# Patient Record
Sex: Female | Born: 1980 | Race: White | Hispanic: No | Marital: Married | Smoking: Current some day smoker
Health system: Southern US, Community
[De-identification: ages and names within clinical notes are randomized; demographics above are authoritative.]

## PROBLEM LIST (undated history)

## (undated) DIAGNOSIS — I10 Essential (primary) hypertension: Secondary | ICD-10-CM

## (undated) DIAGNOSIS — R519 Headache, unspecified: Secondary | ICD-10-CM

## (undated) DIAGNOSIS — H539 Unspecified visual disturbance: Secondary | ICD-10-CM

## (undated) DIAGNOSIS — R51 Headache: Secondary | ICD-10-CM

## (undated) HISTORY — DX: Headache: R51

## (undated) HISTORY — DX: Essential (primary) hypertension: I10

## (undated) HISTORY — DX: Unspecified visual disturbance: H53.9

## (undated) HISTORY — DX: Headache, unspecified: R51.9

---

## 2009-06-20 ENCOUNTER — Ambulatory Visit: Payer: Self-pay | Admitting: Interventional Radiology

## 2009-06-20 ENCOUNTER — Ambulatory Visit (HOSPITAL_BASED_OUTPATIENT_CLINIC_OR_DEPARTMENT_OTHER): Admission: RE | Admit: 2009-06-20 | Discharge: 2009-06-20 | Payer: Self-pay | Admitting: Family Medicine

## 2011-06-28 ENCOUNTER — Ambulatory Visit (HOSPITAL_COMMUNITY): Payer: Self-pay | Admitting: Licensed Clinical Social Worker

## 2016-02-07 ENCOUNTER — Ambulatory Visit (INDEPENDENT_AMBULATORY_CARE_PROVIDER_SITE_OTHER): Payer: BLUE CROSS/BLUE SHIELD | Admitting: Neurology

## 2016-02-07 ENCOUNTER — Encounter: Payer: Self-pay | Admitting: Neurology

## 2016-02-07 VITALS — BP 162/99 | Ht 64.0 in | Wt 177.4 lb

## 2016-02-07 DIAGNOSIS — H4710 Unspecified papilledema: Secondary | ICD-10-CM | POA: Diagnosis not present

## 2016-02-07 DIAGNOSIS — H538 Other visual disturbances: Secondary | ICD-10-CM

## 2016-02-07 NOTE — Progress Notes (Signed)
NEUROLOGY CLINIC NEW PATIENT NOTE  NAME: Courtney Rasmussen DOB: 29-Nov-1980 REFERRING PHYSICIAN: Michaelle Copas, MD  I saw Courtney Rasmussen as a new consult in the neurovascular clinic today regarding  Chief Complaint  Patient presents with  . Referral    Referral from Dr. Despina Arias eye doctor for headaches,optic nerve edema, right eye moderate, left eye mild optic nerve edema, droopy eyelids  .  HPI: Courtney Rasmussen is a 35 y.o. female with PMH of depression on prozac who presents as a new patient for papilledema.   She stated that she has had mild HA for several years, right frontal pressure like, 5/10, on and off, takes tylenol or ibuprofen, HA gone in one hour, no photo- or phonophobia, no N/V, usually 2-3 per week. Bending over or cough not causing further HA but causes ear ringing.   For the last 6-7 months, she noticed blurry vision with far sight, seeing near objects without problem. She went to see optometrist and had new glasses. On 01/20/16 eye exam, she was found to have mild bilateral papilledema, she was referred here for further evaluation. She stated that her HA and blurry vision are not associated each other, pretty much separated events.   She had pre-eclampsia with pregnancy, and BP 240s. Today BP 150/101. She did not check BP at home. She denies DM or HLD. Has hx of depression for a long time and taking prozac.   She is current smoker, 1PPD for about 12 years. Social alcohol, no illicit drugs. She is not on OCP.   Past Medical History:  Diagnosis Date  . Headache   . Hypertension   . Vision abnormalities    No past surgical history on file. Family History  Problem Relation Age of Onset  . Seizures Father    Current Outpatient Prescriptions  Medication Sig Dispense Refill  . diphenhydrAMINE (BENADRYL) 25 MG tablet Take 25 mg by mouth at bedtime. Patient use for sleep    . FLUoxetine (PROZAC) 20 MG capsule Take 20 mg by mouth daily.    Marland Kitchen ibuprofen (ADVIL,MOTRIN) 800 MG  tablet TAKE ONE TABLET (800 MG TOTAL) BY MOUTH EVERY 8 (EIGHT) HOURS AS NEEDED FOR PAIN.  1   No current facility-administered medications for this visit.    Allergies  Allergen Reactions  . Penicillins Hives   Social History   Social History  . Marital status: Married    Spouse name: N/A  . Number of children: N/A  . Years of education: N/A   Occupational History  . Not on file.   Social History Main Topics  . Smoking status: Current Some Day Smoker    Years: 1.00  . Smokeless tobacco: Never Used  . Alcohol use Not on file  . Drug use: Unknown  . Sexual activity: Not on file   Other Topics Concern  . Not on file   Social History Narrative  . No narrative on file    Review of Systems Full 14 system review of systems performed and notable only for those listed, all others are neg:  Constitutional:   Cardiovascular: chest pain Ear/Nose/Throat:  Ringing in ears Skin:  Eyes:  Blurry vision, eye pain Respiratory:   Gastroitestinal:  diarrhea Genitourinary:  Hematology/Lymphatic:   Endocrine:  Musculoskeletal:   Allergy/Immunology:   Neurological:  HA, dizziness Psychiatric: depression, anxiety, decreased energy, disinterested in activities Sleep: insomnia   Physical Exam  Vitals:   02/07/16 1052  BP: (!) 162/99    General -  Well nourished, well developed, in no apparent distress.  Ophthalmologic - mild obscure of disc margin OU, but OD>OS.  Cardiovascular - Regular rate and rhythm with no murmur. Carotid pulses were 2+ without bruits .   Neck - supple, no nuchal rigidity .  Mental Status -  Level of arousal and orientation to time, place, and person were intact. Language including expression, naming, repetition, comprehension, reading, and writing was assessed and found intact. Attention span and concentration were normal. Recent and remote memory were intact. Fund of Knowledge was assessed and was intact.  Cranial Nerves II - XII - II - Visual  field intact OU. III, IV, VI - Extraocular movements intact, palpebral aperture left slightly small then right but no ptosis. V - Facial sensation intact bilaterally. VII - Facial movement intact bilaterally. VIII - Hearing & vestibular intact bilaterally. X - Palate elevates symmetrically. XI - Chin turning & shoulder shrug intact bilaterally. XII - Tongue protrusion intact.  Motor Strength - The patient's strength was normal in all extremities and pronator drift was absent.  Bulk was normal and fasciculations were absent.   Motor Tone - Muscle tone was assessed at the neck and appendages and was normal.  Reflexes - The patient's reflexes were normal in all extremities and she had no pathological reflexes.  Sensory - Light touch, temperature/pinprick, vibration and proprioception, and Romberg testing were assessed and were normal.    Coordination - The patient had normal movements in the hands and feet with no ataxia or dysmetria.  Tremor was absent.  Gait and Station - The patient's transfers, posture, gait, station, and turns were observed as normal.   Imaging none  Lab Review none   Assessment and Plan:   In summary, Courtney Rasmussen is a 35 y.o. female with PMH of depression presents as new pt for blurry vision and mild HA. Exam showed mild papilledema OU, with OD>OS, no ptosis. DDx including brain lesion, CVT or IIH. Will do MRI and MRV, as well as LP if needed. Will also check TSH.   - will do MRI brain with and without and MRV  - will consider LP to check OP if MRI unremarkable. - take tylenol or ibuprofen for HA - let us know if vision or HA getting worse  - check BP at home and record and bring over to PCP or me next visit - follow up with PCP - will check TSH and FT4 - follow up in 2 months or sooner depending on MRI findings or need of spinal tap   Thank you very much for the opportunity to participate in the care of this patient.  Please do not hesitate to call if  any questions or concerns arise.  Orders Placed This Encounter  Procedures  . MR Brain W Wo Contrast    Standing Status:   Future    Standing Expiration Date:   04/10/2017    Order Specific Question:   Reason for Exam (SYMPTOM  OR DIAGNOSIS REQUIRED)    Answer:   papilledema    Order Specific Question:   Preferred imaging location?    Answer:   Internal    Order Specific Question:   Does the patient have a pacemaker or implanted devices?    Answer:   No    Order Specific Question:   What is the patient's sedation requirement?    Answer:   No Sedation  . MR MRV HEAD WO CM    Standing Status:   Future  Standing Expiration Date:   04/09/2017    Order Specific Question:   Reason for Exam (SYMPTOM  OR DIAGNOSIS REQUIRED)    Answer:   papilledema    Order Specific Question:   Preferred imaging location?    Answer:   Internal    Order Specific Question:   What is the patient's sedation requirement?    Answer:   No Sedation    Order Specific Question:   Does the patient have a pacemaker or implanted devices?    Answer:   No  . Basic metabolic panel  . TSH + free T4  . CBC (no diff)  . Protime-INR    Meds ordered this encounter  Medications  . ibuprofen (ADVIL,MOTRIN) 800 MG tablet    Sig: TAKE ONE TABLET (800 MG TOTAL) BY MOUTH EVERY 8 (EIGHT) HOURS AS NEEDED FOR PAIN.    Refill:  1  . FLUoxetine (PROZAC) 20 MG capsule    Sig: Take 20 mg by mouth daily.  . diphenhydrAMINE (BENADRYL) 25 MG tablet    Sig: Take 25 mg by mouth at bedtime. Patient use for sleep    Patient Instructions  - will do MRI brain to rule out structural lesion - will consider spinal tap if MRI unremarkable. - take tylenol or ibuprofen for HA - follow up with optometrist for blurry vision and eye glasses.  - let us know if vision or HA getting worse  - check BP at home and record to see if you need BP meds - follow up with PCP - will draw blood to check kidney function - follow up in 2 months or sooner  depending on MRI findings or need of spinal tap   Marvel Plan, MD PhD [facility] [address], [address] [place], [place] [address] [phone]

## 2016-02-07 NOTE — Patient Instructions (Signed)
-   will do MRI brain to rule out structural lesion - will consider spinal tap if MRI unremarkable. - take tylenol or ibuprofen for HA - follow up with optometrist for blurry vision and eye glasses.  - let us know if vision or HA getting worse  - check BP at home and record to see if you need BP meds - follow up with PCP - will draw blood to check kidney function - follow up in 2 months or sooner depending on MRI findings or need of spinal tap

## 2016-02-08 LAB — PROTIME-INR
INR: 1 (ref 0.8–1.2)
PROTHROMBIN TIME: 9.9 s (ref 9.1–12.0)

## 2016-02-08 LAB — BASIC METABOLIC PANEL
BUN/Creatinine Ratio: 7 — ABNORMAL LOW (ref 9–23)
BUN: 5 mg/dL — AB (ref 6–20)
CALCIUM: 9.7 mg/dL (ref 8.7–10.2)
CO2: 22 mmol/L (ref 18–29)
CREATININE: 0.71 mg/dL (ref 0.57–1.00)
Chloride: 102 mmol/L (ref 96–106)
GFR calc Af Amer: 130 mL/min/{1.73_m2} (ref >59)
GFR, EST NON AFRICAN AMERICAN: 113 mL/min/{1.73_m2} (ref >59)
GLUCOSE: 91 mg/dL (ref 65–99)
Potassium: 5.1 mmol/L (ref 3.5–5.2)
SODIUM: 142 mmol/L (ref 134–144)

## 2016-02-08 LAB — TSH+FREE T4
Free T4: 1.24 ng/dL (ref 0.82–1.77)
TSH: 1.3 u[IU]/mL (ref 0.450–4.500)

## 2016-02-08 LAB — CBC
HEMATOCRIT: 45 % (ref 34.0–46.6)
HEMOGLOBIN: 15.3 g/dL (ref 11.1–15.9)
MCH: 31.4 pg (ref 26.6–33.0)
MCHC: 34 g/dL (ref 31.5–35.7)
MCV: 92 fL (ref 79–97)
Platelets: 240 10*3/uL (ref 150–379)
RBC: 4.88 x10E6/uL (ref 3.77–5.28)
RDW: 13.3 % (ref 12.3–15.4)
WBC: 9.7 10*3/uL (ref 3.4–10.8)

## 2016-02-13 ENCOUNTER — Telehealth: Payer: Self-pay | Admitting: Neurology

## 2016-02-13 NOTE — Telephone Encounter (Signed)
Pt called about scheduling imaging studies

## 2016-02-15 NOTE — Telephone Encounter (Signed)
Called patient and gave her the number to [facility], she was appreciative.

## 2016-02-27 ENCOUNTER — Ambulatory Visit
Admission: RE | Admit: 2016-02-27 | Discharge: 2016-02-27 | Disposition: A | Discharge status: Home / Self Care | Payer: BLUE CROSS/BLUE SHIELD | Source: Ambulatory Visit | Attending: Neurology | Admitting: Neurology

## 2016-02-27 DIAGNOSIS — H4710 Unspecified papilledema: Secondary | ICD-10-CM | POA: Diagnosis not present

## 2016-02-27 MED ORDER — GADOBENATE DIMEGLUMINE 529 MG/ML IV SOLN: 15.0000 mL | Freq: Once | INTRAVENOUS | Status: DC | PRN

## 2016-02-27 MED ORDER — GADOBENATE DIMEGLUMINE 529 MG/ML IV SOLN
15.0000 mL | Freq: Once | INTRAVENOUS | Status: AC | PRN
  Administered 2016-02-27: 15 mL via INTRAVENOUS

## 2016-02-28 ENCOUNTER — Telehealth: Payer: Self-pay | Admitting: Neurology

## 2016-02-28 NOTE — Telephone Encounter (Signed)
Patient called requesting MRI results, states she left message this morning and no one has called her back. Please call [phone].

## 2016-02-28 NOTE — Telephone Encounter (Signed)
Pt called requesting MRI results please call.

## 2016-02-28 NOTE — Telephone Encounter (Signed)
Patient called back regarding MRI results, states she has called twice today and "still no one has called her back". Patient advised (per [name]), Dr. Roda ShuttersXu is working at the hospital this week. A message was sent to him this morning. He is aware of the MRI being done. Patient will be called within 24 to 48 hours of results. Patient states "she will call back again tomorrow or the next day".

## 2016-02-28 NOTE — Telephone Encounter (Signed)
Called pt and delivered MRI and MRV result to her which were all normal. I told her that I will arrange her to come in next Friday 12pm to do the LP. She is OK with that.  Hi, Courtney Rasmussen, could you please schedule her to come in next Friday 8/25 for LP at 12pm. We also need the LP consent to be signed at that time. Thanks.  Marvel PlanJindong Aireana Ryland, MD PhD Stroke Neurology 02/28/2016 6:05 PM

## 2016-02-28 NOTE — Telephone Encounter (Signed)
If patient calls back she had MRI yesterday. Dr. Roda ShuttersXu is working at the hospital this week. A message was sent to him this am. He is aware of the MRi being done. Pt will be call within 24 to 48 hours of results.

## 2016-02-29 NOTE — Telephone Encounter (Signed)
Tried to call patient back that her LP was schedule next Friday at 1200pm. Unable to leave message pts vm was full. Will try later.

## 2016-02-29 NOTE — Telephone Encounter (Signed)
Rn call patient back about confirming the LP procedure. Rn stated to check in at 1130am. Rn explain a call was made earlier but her vm was full. Pt will have her husband drive her to and from the appt.

## 2016-03-09 ENCOUNTER — Ambulatory Visit (INDEPENDENT_AMBULATORY_CARE_PROVIDER_SITE_OTHER): Payer: BLUE CROSS/BLUE SHIELD | Admitting: Neurology

## 2016-03-09 VITALS — BP 111/80 | HR 73 | Ht 64.0 in | Wt 180.8 lb

## 2016-03-09 DIAGNOSIS — H4710 Unspecified papilledema: Secondary | ICD-10-CM

## 2016-03-09 MED ORDER — ACETAZOLAMIDE 250 MG PO TABS: ORAL_TABLET | ORAL | 2 refills | Status: DC

## 2016-03-09 NOTE — Progress Notes (Signed)
Procedure Note: Lumbar puncture  Indications: assess for CNS pathology   Operator: Marvel PlanJindong Hassan Blackshire, MD, PhD  Others present: Sherrie GeorgeKatrina Murrell, RN  Indications, risks, and benefits explained to patient / surrogate decision maker and informed consent obtained. Time-out was performed, with all individuals present agreeing on the procedure to be performed, the site of procedure, and the patient identity.  Patient positioned, prepped and draped in usual sterile fashion. L3-4 space located using bilateral iliac crests as landmarks. 1% Lidocaine without epinephrine was used to anesthetize the area. An 20G spinal needle was introduced into the sub- arachnoid space. Stylet was removed with appropriate fluid return. Needle removed after adequate fluid collected. Blood loss was minimal. A dry guaze dressing was placed over insertion site. Patient tolerated the procedure well and no complications were observed. Spontaneous movement of bilateral extremities were observed after the procedure.  Opening pressure: 20 cm H20 with leg extended  Total fluid removed: 4ml  Color of fluid: clear  Sent for: cell count + diff , glucose, protein, VDRL, oligoclonal band   Marvel PlanJindong Cheronda Erck, MD PhD Stroke Neurology 03/09/2016 1:07 PM

## 2016-03-09 NOTE — Progress Notes (Signed)
Pt OP was 20cmH2O, at higher end of the normal pressure. Taken into consideration of mild papilledema, will initiate diamox low dose 250mg  Qhs x 5 days and then 250mg  bid. Will need close follow up with ophthalmology or optometrist.   [name], MD PhD Stroke Neurology 03/09/2016 1:12 PM  Meds ordered this encounter  Medications  . acetaZOLAMIDE (DIAMOX) 250 MG tablet    Sig: Take one tab 250mg  nightly for 5 days and then 250mg  bid after    Dispense:  60 tablet    Refill:  2

## 2016-03-10 LAB — CELL COUNT, CSF
NUC CELL # CSF: 2 {cells}/uL (ref 0–5)
RBC, CSF: 33 /uL

## 2016-03-11 ENCOUNTER — Telehealth: Payer: Self-pay | Admitting: Diagnostic Neuroimaging

## 2016-03-11 NOTE — Telephone Encounter (Signed)
Patient called in. Had LPO in the office last Friday 03/09/16. Today, having intermittent low back pain shooting to the right leg (new). No weakness. No other issues. Advised option to go to ER or urgent care today to evaluate. Patient would like ot hold off. WIll forward message to Dr. Roda ShuttersXu and his nurse to review. Patient may need to come into office for eval vs get MRI lumbar spine.   Suanne MarkerVIKRAM R. PENUMALLI, MD 03/11/2016, 5:28 PM Certified in Neurology, Neurophysiology and Neuroimaging  [facility] [address], [address] [place], [place] [address] [phone]

## 2016-03-12 ENCOUNTER — Telehealth: Payer: Self-pay | Admitting: Neurology

## 2016-03-12 ENCOUNTER — Emergency Department (HOSPITAL_COMMUNITY)
Admission: EM | Admit: 2016-03-12 | Discharge: 2016-03-13 | Disposition: A | Discharge status: Home / Self Care | Payer: BLUE CROSS/BLUE SHIELD | Attending: Emergency Medicine | Admitting: Emergency Medicine

## 2016-03-12 ENCOUNTER — Encounter (HOSPITAL_COMMUNITY): Payer: Self-pay

## 2016-03-12 DIAGNOSIS — M5416 Radiculopathy, lumbar region: Secondary | ICD-10-CM | POA: Insufficient documentation

## 2016-03-12 DIAGNOSIS — I10 Essential (primary) hypertension: Secondary | ICD-10-CM | POA: Diagnosis not present

## 2016-03-12 DIAGNOSIS — F17200 Nicotine dependence, unspecified, uncomplicated: Secondary | ICD-10-CM | POA: Insufficient documentation

## 2016-03-12 DIAGNOSIS — M549 Dorsalgia, unspecified: Secondary | ICD-10-CM

## 2016-03-12 DIAGNOSIS — R200 Anesthesia of skin: Secondary | ICD-10-CM

## 2016-03-12 DIAGNOSIS — M545 Low back pain: Secondary | ICD-10-CM | POA: Diagnosis present

## 2016-03-12 NOTE — ED Provider Notes (Signed)
[facility]-EMERGENCY DEPT Provider Note   CSN: [id number] Arrival date & time: 03/12/16  1746  By signing my name below, I, Courtney Rasmussen, attest that this documentation has been prepared under the direction and in the presence of Courtney Crease, MD. Electronically Signed: [name], ED Scribe. 03/12/16. 11:56 PM.    History   Chief Complaint Chief Complaint  Patient presents with  . Back Pain  . Leg Pain   HPI Comments: Courtney Rasmussen is a 35 y.o. female with a hx of hypertension who presents to the Emergency Department complaining of gradually worsening moderate lower back pain and pain to site of recent LP that radiates down right leg to her popliteal onset yesterday. He reports associated tingling in her upper leg. She explains that she had an LP 3 days ago after being diagnosed with Papilledema and her symptoms began yesterday afternoon. No alleviating factors noted. Pt has not tried any medications PTA. She denies any fever, chills, numbness, weakness, or bowel/bladder incontinence.    The history is provided by the patient. No language interpreter was used.  Back Pain   This is a new problem. The current episode started yesterday. The problem has been gradually worsening. The pain radiates to the right knee. The pain is moderate. Associated symptoms include leg pain and tingling. Pertinent negatives include no fever, no numbness, no bowel incontinence, no bladder incontinence and no weakness. She has tried nothing for the symptoms.  Extremity Pain  Associated symptoms include tingling. Pertinent negatives include no numbness.    Past Medical History:  Diagnosis Date  . Headache   . Hypertension   . Vision abnormalities     Patient Active Problem List   Diagnosis Date Noted  . Papilledema 02/07/2016  . Blurry vision 02/07/2016    History reviewed. No pertinent surgical history.  OB History    No data available       Home Medications    Prior to  Admission medications   Medication Sig Start Date End Date Taking? Authorizing Provider  acetaZOLAMIDE (DIAMOX) 250 MG tablet Take one tab 250mg  nightly for 5 days and then 250mg  bid after Patient taking differently: Take 250 mg by mouth 2 (two) times daily.  03/09/16  Yes Marvel Plan, MD  FLUoxetine (PROZAC) 10 MG tablet Take 30 mg by mouth daily.   Yes Historical Provider, MD  methylPREDNISolone (MEDROL DOSEPAK) 4 MG TBPK tablet As directed 03/13/16   Courtney Crease, MD  oxyCODONE-acetaminophen (PERCOCET/ROXICET) 5-325 MG tablet Take 1-2 tablets by mouth every 4 (four) hours as needed for severe pain. 03/13/16   Courtney Crease, MD    Family History Family History  Problem Relation Age of Onset  . Seizures Father     Social History Social History  Substance Use Topics  . Smoking status: Current Some Day Smoker    Years: 1.00  . Smokeless tobacco: Never Used  . Alcohol use Not on file     Allergies   Penicillins   Review of Systems Review of Systems  Constitutional: Negative for chills and fever.  Gastrointestinal: Negative for bowel incontinence.  Genitourinary: Negative for bladder incontinence.  Musculoskeletal: Positive for back pain.  Neurological: Positive for tingling. Negative for weakness and numbness.  All other systems reviewed and are negative.    Physical Exam Updated Vital Signs BP (!) 133/108   Pulse 76   Temp 98.1 F (36.7 C)   Resp 18   Ht 5\' 4"  (1.626 m)   Wt 180  lb (81.6 kg)   SpO2 100%   BMI 30.90 kg/m   Physical Exam  Constitutional: She is oriented to person, place, and time. She appears well-developed and well-nourished. No distress.  HENT:  Head: Normocephalic and atraumatic.  Right Ear: Hearing normal.  Left Ear: Hearing normal.  Nose: Nose normal.  Mouth/Throat: Oropharynx is clear and moist and mucous membranes are normal.  Eyes: Conjunctivae and EOM are normal. Pupils are equal, round, and reactive to light.  Neck:  Normal range of motion. Neck supple.  Cardiovascular: Regular rhythm, S1 normal and S2 normal.  Exam reveals no gallop and no friction rub.   No murmur heard. Pulmonary/Chest: Effort normal and breath sounds normal. No respiratory distress. She exhibits no tenderness.  Abdominal: Soft. Normal appearance and bowel sounds are normal. There is no hepatosplenomegaly. There is no tenderness. There is no rebound, no guarding, no tenderness at McBurney's point and negative Murphy's sign. No hernia.  Musculoskeletal: Normal range of motion. She exhibits tenderness.  Lower back: no erythema, induration, fluctuance. Mild soft tissue tenderness. Some radicular pain with straight leg raise on the right. Normal strength and sensation. Normal patellar reflex  Neurological: She is alert and oriented to person, place, and time. She has normal strength. No cranial nerve deficit or sensory deficit. Coordination normal. GCS eye subscore is 4. GCS verbal subscore is 5. GCS motor subscore is 6.  Skin: Skin is warm, dry and intact. No rash noted. No cyanosis.  Psychiatric: She has a normal mood and affect. Her speech is normal and behavior is normal. Thought content normal.  Nursing note and vitals reviewed.    ED Treatments / Results  DIAGNOSTIC STUDIES: Oxygen Saturation is 100% on RA, normal by my interpretation.    COORDINATION OF CARE: 11:44 PM- Pt advised of plan for treatment and pt agrees. She will receive lab work and MR lumbar spine w and w/o contrast for further evaluation.    Labs (all labs ordered are listed, but only abnormal results are displayed) Labs Reviewed  CBC WITH DIFFERENTIAL/PLATELET - Abnormal; Notable for the following:       Result Value   WBC 10.8 (*)    RBC 5.13 (*)    Hemoglobin 16.1 (*)    HCT 47.5 (*)    All other components within normal limits  BASIC METABOLIC PANEL - Abnormal; Notable for the following:    CO2 20 (*)    All other components within normal limits     EKG  EKG Interpretation None       Radiology Mr Lumbar Spine W Wo Contrast  Result Date: 03/13/2016 CLINICAL DATA:  Gradually worsening lower back pain common near site of recent LP. EXAM: MRI LUMBAR SPINE WITHOUT AND WITH CONTRAST TECHNIQUE: Multiplanar and multiecho pulse sequences of the lumbar spine were obtained without and with intravenous contrast. CONTRAST:  18mL MULTIHANCE GADOBENATE DIMEGLUMINE 529 MG/ML IV SOLN COMPARISON:  None. FINDINGS: Segmentation:  Normal Alignment:  Normal Vertebrae: No acute compression fracture, facet edema or focal marrow lesion. Conus medullaris: Extends to the L1 level and appears normal. There is no abnormal contrast enhancement of the nerve roots. There is no epidural hematoma or fluid collection. Paraspinal and other soft tissues: The visualized aorta, IVC and iliac vessels are normal. The visualized retroperitoneal organs and paraspinal soft tissues are normal. Disc levels: There is congenital narrowing of the spinal canal secondary to short pedicles. T12-L1: Normal disc space and facet joints. No spinal canal stenosis. No neuroforaminal stenosis. L1-L2:  Normal disc space and facet joints. No spinal canal stenosis. No neuroforaminal stenosis. L2-L3: Normal disc space and facet joints. No spinal canal stenosis. No neuroforaminal stenosis. L3-L4: Minimal disc bulge and mild facet hypertrophy. Narrowing of the spinal canal secondary to congenitally short pedicles. No neuroforaminal stenosis. L4-L5: Minimal disc bulge and mild facet hypertrophy. Mild to moderate narrowing of the spinal canal due to congenital short pedicles. No neuroforaminal stenosis. L5-S1: Normal disc space and facet joints. No spinal canal stenosis. No neuroforaminal stenosis. IMPRESSION: 1. No abnormal contrast enhancement of the nerve roots or epidural fluid collection/hematoma. 2. 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 This is greatest at the L3-4 and  L4-5 levels, where there are superimposed small disc bulges. Electronically Signed   By: Deatra Robinson M.D.   On: 03/13/2016 02:11    Procedures Procedures (including critical care time)  Medications Ordered in ED Medications  dexamethasone (DECADRON) injection 10 mg (not administered)  HYDROmorphone (DILAUDID) injection 1 mg (not administered)  ketorolac (TORADOL) 15 MG/ML injection 15 mg (not administered)  HYDROmorphone (DILAUDID) injection 1 mg (1 mg Intravenous Given 03/13/16 0031)  ondansetron (ZOFRAN) injection 4 mg (4 mg Intravenous Given 03/13/16 0031)  acetaZOLAMIDE (DIAMOX) tablet 250 mg (250 mg Oral Given 03/13/16 0206)  diazepam (VALIUM) injection 5 mg (5 mg Intravenous Given 03/13/16 0051)  gadobenate dimeglumine (MULTIHANCE) injection 20 mL (18 mLs Intravenous Contrast Given 03/13/16 0148)     Initial Impression / Assessment and Plan / ED Course  Courtney Crease, MD has reviewed the triage vital signs and the nursing notes.  Pertinent labs & imaging results that were available during my care of the patient were reviewed by me and considered in my medical decision making (see chart for details).  Clinical Course   Patient presents to the emergency department for evaluation of right lower back pain and radiculopathy. Patient underwent lumbar puncture procedure 3 days ago during workup for papilledema. Patient reports that approximately 1 day after the procedure she started having low back pain and the pain now is sharp and radiates down her right leg. She did not have any neurologic deficit on examination. She was referred to the emergency department for emergent MRI to rule out hematoma. MRI has been performed and there are no acute abnormalities noted. Patient reassured, will treat with analgesia and steroid, follow-up with neurology.  Final Clinical Impressions(s) / ED Diagnoses   Final diagnoses:  Numbness  Lumbar radiculopathy    New Prescriptions New  Prescriptions   METHYLPREDNISOLONE (MEDROL DOSEPAK) 4 MG TBPK TABLET    As directed   OXYCODONE-ACETAMINOPHEN (PERCOCET/ROXICET) 5-325 MG TABLET    Take 1-2 tablets by mouth every 4 (four) hours as needed for severe pain.   I personally performed the services described in this documentation, which was scribed in my presence. The recorded information has been reviewed and is accurate.    Courtney Crease, MD 03/13/16 [phone]

## 2016-03-12 NOTE — Telephone Encounter (Signed)
Pt called as advised by her husband. The msg was relayed, [facility] 

## 2016-03-12 NOTE — ED Notes (Signed)
Pt states that she had "fuzzy tingling" feeling in the right knee for two hours around lunch time today. Pain getting steadily worse throughout the day. Pain in the lumbar and sacral region of spine feels more like pressure.

## 2016-03-12 NOTE — Telephone Encounter (Signed)
Called home phone and she was not available and VM was full and not able to leave message.  Called her husband and told him that [name] should go to ER for MRI lumbar spine with and without contrast to rule out lumbar epidural hematoma. Husband said he will call pt and let her know. He will also ask pt to call us back.   Hi, [name], if pt calls back, please ask her to go to ER for MRI to rule out above. Thanks.  Courtney PlanJindong Weylyn Ricciuti, MD PhD Stroke Neurology 03/12/2016 1:11 PM

## 2016-03-12 NOTE — ED Notes (Signed)
Attempted to update pts vitals family said she is outside

## 2016-03-12 NOTE — Telephone Encounter (Signed)
Per prior conversation, patient was supposed to go to the emergency room to get a lumbar spine MRI with and without contrast, as advised by Dr. Roda ShuttersXu earlier today, I will place an order. Patient called the after hours call line and reported that there was no order in place. I will place an order for an MRI lumbar spine without contrast to be done at [facility], patient at the ER now. She was advised to proceed to the emergency room by Dr. Roda ShuttersXu. I called patient back to notify her.

## 2016-03-12 NOTE — ED Triage Notes (Signed)
Pt states received lumbar puncture on Friday. Pt states back pain that radiates to R leg today.

## 2016-03-13 ENCOUNTER — Encounter (HOSPITAL_COMMUNITY): Payer: Self-pay | Admitting: *Deleted

## 2016-03-13 ENCOUNTER — Emergency Department (HOSPITAL_COMMUNITY): Payer: BLUE CROSS/BLUE SHIELD

## 2016-03-13 LAB — CBC WITH DIFFERENTIAL/PLATELET
BASOS ABS: 0.1 10*3/uL (ref 0.0–0.1)
BASOS PCT: 1 %
EOS ABS: 0.4 10*3/uL (ref 0.0–0.7)
Eosinophils Relative: 4 %
HEMATOCRIT: 47.5 % — AB (ref 36.0–46.0)
HEMOGLOBIN: 16.1 g/dL — AB (ref 12.0–15.0)
Lymphocytes Relative: 30 %
Lymphs Abs: 3.2 10*3/uL (ref 0.7–4.0)
MCH: 31.4 pg (ref 26.0–34.0)
MCHC: 33.9 g/dL (ref 30.0–36.0)
MCV: 92.6 fL (ref 78.0–100.0)
MONO ABS: 0.7 10*3/uL (ref 0.1–1.0)
Monocytes Relative: 6 %
NEUTROS ABS: 6.5 10*3/uL (ref 1.7–7.7)
NEUTROS PCT: 59 %
Platelets: 211 10*3/uL (ref 150–400)
RBC: 5.13 MIL/uL — ABNORMAL HIGH (ref 3.87–5.11)
RDW: 13.6 % (ref 11.5–15.5)
WBC: 10.8 10*3/uL — ABNORMAL HIGH (ref 4.0–10.5)

## 2016-03-13 LAB — BASIC METABOLIC PANEL
ANION GAP: 6 (ref 5–15)
BUN: 6 mg/dL (ref 6–20)
CALCIUM: 9.6 mg/dL (ref 8.9–10.3)
CHLORIDE: 111 mmol/L (ref 101–111)
CO2: 20 mmol/L — ABNORMAL LOW (ref 22–32)
CREATININE: 0.85 mg/dL (ref 0.44–1.00)
GFR calc non Af Amer: >60 mL/min (ref >60)
Glucose, Bld: 89 mg/dL (ref 65–99)
Potassium: 3.6 mmol/L (ref 3.5–5.1)
SODIUM: 137 mmol/L (ref 135–145)

## 2016-03-13 MED ORDER — DIAZEPAM 5 MG/ML IJ SOLN
5.0000 mg | Freq: Once | INTRAMUSCULAR | Status: AC
  Administered 2016-03-13: 5 mg via INTRAVENOUS
  Filled 2016-03-13: qty 2

## 2016-03-13 MED ORDER — OXYCODONE-ACETAMINOPHEN 5-325 MG PO TABS: 1.0000 | ORAL_TABLET | ORAL | 0 refills | Status: DC | PRN

## 2016-03-13 MED ORDER — HYDROMORPHONE HCL 1 MG/ML IJ SOLN
1.0000 mg | Freq: Once | INTRAMUSCULAR | Status: AC
  Administered 2016-03-13: 1 mg via INTRAVENOUS
  Filled 2016-03-13: qty 1

## 2016-03-13 MED ORDER — ACETAZOLAMIDE 250 MG PO TABS
250.0000 mg | ORAL_TABLET | Freq: Once | ORAL | Status: AC
  Administered 2016-03-13: 250 mg via ORAL
  Filled 2016-03-13: qty 1

## 2016-03-13 MED ORDER — ONDANSETRON HCL 4 MG/2ML IJ SOLN
4.0000 mg | Freq: Once | INTRAMUSCULAR | Status: AC
  Administered 2016-03-13: 4 mg via INTRAVENOUS
  Filled 2016-03-13: qty 2

## 2016-03-13 MED ORDER — GADOBENATE DIMEGLUMINE 529 MG/ML IV SOLN
20.0000 mL | Freq: Once | INTRAVENOUS | Status: AC | PRN
  Administered 2016-03-13: 18 mL via INTRAVENOUS

## 2016-03-13 MED ORDER — METHYLPREDNISOLONE 4 MG PO TBPK: ORAL_TABLET | ORAL | 0 refills | Status: DC

## 2016-03-13 MED ORDER — DEXAMETHASONE SODIUM PHOSPHATE 10 MG/ML IJ SOLN
10.0000 mg | Freq: Once | INTRAMUSCULAR | Status: AC
  Administered 2016-03-13: 10 mg via INTRAVENOUS
  Filled 2016-03-13: qty 1

## 2016-03-13 MED ORDER — KETOROLAC TROMETHAMINE 15 MG/ML IJ SOLN
15.0000 mg | Freq: Once | INTRAMUSCULAR | Status: AC
  Administered 2016-03-13: 15 mg via INTRAVENOUS
  Filled 2016-03-13: qty 1

## 2016-03-13 NOTE — ED Notes (Signed)
Patient is stable, ambulatory, verbalized understanding of medications prescribed and discharge instructions, sent home with spouse.

## 2016-03-15 ENCOUNTER — Other Ambulatory Visit: Payer: Self-pay | Admitting: Neurology

## 2016-03-15 ENCOUNTER — Telehealth: Payer: Self-pay | Admitting: Neurology

## 2016-03-15 LAB — GLUCOSE, CSF: GLUCOSE CSF: 61 mg/dL (ref 40–70)

## 2016-03-15 LAB — VDRL, CSF: SYPHILIS VDRL QUANT CSF: NONREACTIVE

## 2016-03-15 LAB — OLIGOCLONAL BANDS, CSF + SERM

## 2016-03-15 LAB — PROTEIN, CSF: TOTAL PROTEIN, CSF: 46.9 mg/dL — AB (ref 0.0–44.0)

## 2016-03-15 MED ORDER — OXYCODONE-ACETAMINOPHEN 10-325 MG PO TABS: 1.0000 | ORAL_TABLET | Freq: Four times a day (QID) | ORAL | 0 refills | Status: DC | PRN

## 2016-03-15 NOTE — Telephone Encounter (Signed)
I am OK to give her percocet 5-325mg  one tablet every 6 hours PRN for 5 days. Total 20 tablets and no refills. Thanks.  Marvel PlanJindong Yariana Hoaglund, MD PhD Stroke Neurology 03/15/2016 5:07 PM

## 2016-03-15 NOTE — Telephone Encounter (Signed)
Rn call patient back about her perocet medication. Pt was given 20 pills at the hospital for leg pain. Pt went to ED on 03/13/2016 because of pain from the LP on  03/09/2016. Rn stated she will only being given 20 pills taking one every 6 hours with no refills. Rn stated PEr Dr. Roda ShuttersXu she should be healing when the 20 pills are up. Pt verbalized understanding. Rn stated its a schedule two and she has to pick up from office and sign out. Rn advise pt to fill out DPr for her husband to be on the form.

## 2016-03-15 NOTE — Telephone Encounter (Signed)
Pt called in requesting rx for pain medication. She was seen in there ER but they did not giver her enough to last through the weekend. Please call and advise [phone] oxyCODONE-acetaminophen (PERCOCET/ROXICET) 5-325 MG tablet

## 2016-04-19 ENCOUNTER — Ambulatory Visit (INDEPENDENT_AMBULATORY_CARE_PROVIDER_SITE_OTHER): Payer: BLUE CROSS/BLUE SHIELD | Admitting: Neurology

## 2016-04-19 ENCOUNTER — Encounter: Payer: Self-pay | Admitting: Neurology

## 2016-04-19 VITALS — BP 132/91 | HR 84 | Ht 64.0 in | Wt 182.6 lb

## 2016-04-19 DIAGNOSIS — R200 Anesthesia of skin: Secondary | ICD-10-CM | POA: Diagnosis not present

## 2016-04-19 DIAGNOSIS — H4710 Unspecified papilledema: Secondary | ICD-10-CM | POA: Diagnosis not present

## 2016-04-19 DIAGNOSIS — R202 Paresthesia of skin: Secondary | ICD-10-CM | POA: Diagnosis not present

## 2016-04-19 NOTE — Patient Instructions (Signed)
-   take diamox half tablet in the morning and continue one tab at night - will do blood draw today to check potassium - make appointment with eye doctor to check back of eye ASAP.  - follow up with PCP regularly - follow up in 3 months.

## 2016-04-19 NOTE — Progress Notes (Signed)
NEUROLOGY CLINIC FOLLOW UP NOTE  NAME: Courtney Rasmussen DOB: July 30, 1980  HPI: Courtney Rasmussen is a 35 y.o. female with PMH of depression on prozac who presents as a new patient for papilledema.   She stated that she has had mild HA for several years, right frontal pressure like, 5/10, on and off, takes tylenol or ibuprofen, HA gone in one hour, no photo- or phonophobia, no N/V, usually 2-3 per week. Bending over or cough not causing further HA but causes ear ringing.   For the last 6-7 months, she noticed blurry vision with far sight, seeing near objects without problem. She went to see optometrist and had new glasses. On 01/20/16 eye exam, she was found to have mild bilateral papilledema, she was referred here for further evaluation. She stated that her HA and blurry vision are not associated each other, pretty much separated events.   She had pre-eclampsia with pregnancy, and BP 240s. Today BP 150/101. She did not check BP at home. She denies DM or HLD. Has hx of depression for a long time and taking prozac.   She is current smoker, 1PPD for about 12 years. Social alcohol, no illicit drugs. She is not on OCP.   Interval history: During the interval time, MRI and MRV were negative for structural lesion or CVT. She then had LP in our office and opening pressure was 20cmH2O, at higher end of the normal pressure. Started on diamox with titration to 250mg  bid. Post LP, she had LBP and was sent to ER to rule out epidural hematoma. 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   Since then, she complains of fingertip and toe numbness and tingling, and sometimes face and nose tingling sensation, which she feels annoying. The tingling sensation 2-3 times a day and lasting about and goes away. She stated that her mild HA and vision did not change much over the time but she has not seen her eye doctor since early July.   She also mentioned that she developed HTN with pregnancy and was  put on diuretics before and her potassium was low with diuretics. This time with diamox, she also goes to bathroom a lot. So she was worrying about her potassium too. Her BP was 132/91 today in clinic.   Past Medical History:  Diagnosis Date  . Headache   . Hypertension   . Vision abnormalities    History reviewed. No pertinent surgical history. Family History  Problem Relation Age of Onset  . Seizures Father    Current Outpatient Prescriptions  Medication Sig Dispense Refill  . acetaZOLAMIDE (DIAMOX) 250 MG tablet Take one tab 250mg  nightly for 5 days and then 250mg  bid after (Patient taking differently: Take 250 mg by mouth 2 (two) times daily. ) 60 tablet 2   No current facility-administered medications for this visit.    Allergies  Allergen Reactions  . Penicillins Hives   Social History   Social History  . Marital status: Married    Spouse name: N/A  . Number of children: N/A  . Years of education: N/A   Occupational History  . Not on file.   Social History Main Topics  . Smoking status: Current Some Day Smoker    Years: 1.00  . Smokeless tobacco: Never Used  . Alcohol use 1.2 oz/week    1 Cans of beer, 1 Glasses of wine per week     Comment: occasionally  . Drug use: No  . Sexual activity: Not on  file   Other Topics Concern  . Not on file   Social History Narrative  . No narrative on file    Review of Systems Full 14 system review of systems performed and notable only for those listed, all others are neg:  Constitutional:   Cardiovascular:  Ear/Nose/Throat:   Skin:  Eyes:  Blurry vision, eye pain Respiratory:   Gastroitestinal:  diarrhea Genitourinary:  Hematology/Lymphatic:   Endocrine: excessive thirst Musculoskeletal:  Back pain, muscle cramps Allergy/Immunology:   Neurological:  HA, numbness Psychiatric: nervous, anxiety Sleep:    Physical Exam  Vitals:   04/19/16 1138  BP: (!) 132/91  Pulse: 84    General - Well nourished, well  developed, in no apparent distress.  Ophthalmologic - subtle obscure of disc margin OU.  Cardiovascular - Regular rate and rhythm with no murmur. Carotid pulses were 2+ without bruits .   Neck - supple, no nuchal rigidity .  Mental Status -  Level of arousal and orientation to time, place, and person were intact. Language including expression, naming, repetition, comprehension, reading, and writing was assessed and found intact. Attention span and concentration were normal. Recent and remote memory were intact. Fund of Knowledge was assessed and was intact.  Cranial Nerves II - XII - II - Visual field intact OU. III, IV, VI - Extraocular movements intact, palpebral aperture left slightly small then right but no ptosis. V - Facial sensation intact bilaterally. VII - Facial movement intact bilaterally. VIII - Hearing & vestibular intact bilaterally. X - Palate elevates symmetrically. XI - Chin turning & shoulder shrug intact bilaterally. XII - Tongue protrusion intact.  Motor Strength - The patient's strength was normal in all extremities and pronator drift was absent.  Bulk was normal and fasciculations were absent.   Motor Tone - Muscle tone was assessed at the neck and appendages and was normal.  Reflexes - The patient's reflexes were normal in all extremities and she had no pathological reflexes.  Sensory - Light touch, temperature/pinprick, vibration and proprioception, and Romberg testing were assessed and were normal.    Coordination - The patient had normal movements in the hands and feet with no ataxia or dysmetria.  Tremor was absent.  Gait and Station - The patient's transfers, posture, gait, station, and turns were observed as normal.   Imaging I have personally reviewed the radiological images below and agree with the radiology interpretations.  MRI brain with and without Normal   MRV head Normal   Mr Lumbar Spine W Wo Contrast 03/13/2016 IMPRESSION: 1. No  abnormal contrast enhancement of the nerve roots or epidural fluid collection/hematoma. 2. 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 This is greatest at the L3-4 and L4-5 levels, where there are superimposed small disc bulges.   Lab Review Component     Latest Ref Rng & Units 03/09/2016  Color, CSF     Colorless Colorless  Clarity, CSF     Clear Clear  Nuc cell # CSF     0 - 5 cells/uL 2  RBC, CSF     None Seen /uL 33  Polys, CSF      CANCELED  Lymphs, CSF      CANCELED  Macrophages NFr CSF Manual      CANCELED  CELL FRACT CSF-IMP      Comment  Glucose, CSF     40 - 70 mg/dL 61  Protein, CSF     0.0 - 44.0 mg/dL 69.646.9 (H)  VDRL Quant, CSF     Non Rea:<1:1 Non Reactive  Oligoclonal Bands.IT Ser+CSF Ql      Comment   Component     Latest Ref Rng & Units 02/07/2016  TSH     0.450 - 4.500 uIU/mL 1.300  T4,Free(Direct)     0.82 - 1.77 ng/dL 1.61     Assessment:   In summary, Seretha Estabrooks is a 35 y.o. female with PMH of depression presents as new pt for blurry vision and mild HA. Exam showed mild papilledema OU, with OD>OS, no ptosis. MRI and MRV normal, as well as normal CSF but OP 20cmH2O. She was put on diamox 250mg  bid. However, she complains of fingertip and toe as well as face and nose numbness and tingling. Will decrease morning dose of diamox. Request ophthalmology follow up.   Plan: - decrease diamox to 125mg  am and 250mg  pm - will check potassium due to diuretic effect - make appointment with ophthalmology for repeat fundi exam.  - follow up with PCP regularly - follow up in 3 months.   I spent more than 25 minutes of face to face time with the patient. Greater than 50% of time was spent in counseling and coordination of care. We discussed medication side effect, medication dose adjustment, follow up with eye doctor   Orders Placed This Encounter  Procedures  . Basic metabolic panel  . CBC (no diff)  . Magnesium    No orders of  the defined types were placed in this encounter.   Patient Instructions  - take diamox half tablet in the morning and continue one tab at night - will do blood draw today to check potassium - make appointment with eye doctor to check back of eye ASAP.  - follow up with PCP regularly - follow up in 3 months.    Marvel Plan, MD PhD [facility] [address] [place], [place] [address] [phone]

## 2016-04-20 LAB — CBC
HEMATOCRIT: 44.2 % (ref 34.0–46.6)
HEMOGLOBIN: 14.6 g/dL (ref 11.1–15.9)
MCH: 30.4 pg (ref 26.6–33.0)
MCHC: 33 g/dL (ref 31.5–35.7)
MCV: 92 fL (ref 79–97)
PLATELETS: 225 10*3/uL (ref 150–379)
RBC: 4.81 x10E6/uL (ref 3.77–5.28)
RDW: 13.4 % (ref 12.3–15.4)
WBC: 11.5 10*3/uL — ABNORMAL HIGH (ref 3.4–10.8)

## 2016-04-20 LAB — BASIC METABOLIC PANEL
BUN/Creatinine Ratio: 9 (ref 9–23)
BUN: 7 mg/dL (ref 6–20)
CO2: 19 mmol/L (ref 18–29)
CREATININE: 0.74 mg/dL (ref 0.57–1.00)
Calcium: 9.5 mg/dL (ref 8.7–10.2)
Chloride: 111 mmol/L — ABNORMAL HIGH (ref 96–106)
GFR calc Af Amer: 124 mL/min/{1.73_m2} (ref >59)
GFR, EST NON AFRICAN AMERICAN: 108 mL/min/{1.73_m2} (ref >59)
Glucose: 93 mg/dL (ref 65–99)
Potassium: 4.3 mmol/L (ref 3.5–5.2)
SODIUM: 145 mmol/L — AB (ref 134–144)

## 2016-04-20 LAB — MAGNESIUM: Magnesium: 1.9 mg/dL (ref 1.6–2.3)

## 2016-04-23 ENCOUNTER — Telehealth: Payer: Self-pay

## 2016-04-23 NOTE — Telephone Encounter (Signed)
-----   Message from Marvel PlanJindong Xu, MD sent at 04/20/2016 12:41 PM EDT ----- Could you please let the patient know that the blood test done recently in our office showed normal potassium level. Her sodium and white blood cells elevated just a little bit, likely due to dehydration. She is recommended to drink more fluid. Thanks.  Marvel PlanJindong Xu, MD PhD Stroke Neurology 04/20/2016 12:41 PM

## 2016-04-23 NOTE — Telephone Encounter (Signed)
RN call patient about her lab work. Rn stated the blood work showed normal potassium. Sodium and white blood cells little elevated, likely due to dehydration. Per Dr. Roda ShuttersXu she needs to drink more water. Pt verbalized understanding.

## 2016-04-23 NOTE — Telephone Encounter (Signed)
Unable to leave vm for patient. Rn was tried to call for lab but her vm was full.

## 2016-07-24 ENCOUNTER — Encounter: Payer: Self-pay | Admitting: Neurology

## 2016-07-24 ENCOUNTER — Ambulatory Visit (INDEPENDENT_AMBULATORY_CARE_PROVIDER_SITE_OTHER): Payer: BLUE CROSS/BLUE SHIELD | Admitting: Neurology

## 2016-07-24 VITALS — BP 137/89 | HR 68 | Wt 186.8 lb

## 2016-07-24 DIAGNOSIS — R202 Paresthesia of skin: Secondary | ICD-10-CM

## 2016-07-24 DIAGNOSIS — R200 Anesthesia of skin: Secondary | ICD-10-CM | POA: Diagnosis not present

## 2016-07-24 DIAGNOSIS — H538 Other visual disturbances: Secondary | ICD-10-CM | POA: Diagnosis not present

## 2016-07-24 DIAGNOSIS — H4710 Unspecified papilledema: Secondary | ICD-10-CM

## 2016-07-24 NOTE — Progress Notes (Signed)
NEUROLOGY CLINIC FOLLOW UP NOTE  NAME: Courtney Rasmussen DOB: [birth year]  HPI: Courtney Rasmussen is a 36 y.o. female with PMH of depression on prozac who presents as a new patient for papilledema.   She stated that she has had mild HA for several years, right frontal pressure like, 5/10, on and off, takes tylenol or ibuprofen, HA gone in one hour, no photo- or phonophobia, no N/V, usually 2-3 per week. Bending over or cough not causing further HA but causes ear ringing.   For the last 6-7 months, she noticed blurry vision with far sight, seeing near objects without problem. She went to see optometrist and had new glasses. On 01/20/16 eye exam, she was found to have mild bilateral papilledema, she was referred here for further evaluation. She stated that her HA and blurry vision are not associated each other, pretty much separated events.   She had pre-eclampsia with pregnancy, and BP 240s. Today BP 150/101. She did not check BP at home. She denies DM or HLD. Has hx of depression for a long time and taking prozac.   She is current smoker, 1PPD for about 12 years. Social alcohol, no illicit drugs. She is not on OCP.   Follow up 04/19/16 - MRI and MRV were negative for structural lesion or CVT. She then had LP in our office and opening pressure was 20cmH2O, at higher end of the normal pressure. Started on diamox with titration to 250mg  bid. Post LP, she had LBP and was sent to ER to rule out epidural hematoma. 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   Since then, she complains of fingertip and toe numbness and tingling, and sometimes face and nose tingling sensation, which she feels annoying. The tingling sensation 2-3 times a day and lasting about and goes away. She stated that her mild HA and vision did not change much over the time but she has not seen her eye doctor since early July.   She also mentioned that she developed HTN with pregnancy and was put on diuretics before  and her potassium was low with diuretics. This time with diamox, she also goes to bathroom a lot. So she was worrying about her potassium too. Her BP was 132/91 today in clinic.   Interval history: During the interval time, pt neuro stable. However, she did not change her diamox dose to 125/250. She still on 250mg  bid and she complains of diarrhea and increase urination. She continue to complain of facial, hand and feet numbness feeling, more prominent in face. She stated that her vision getting worse and recently changed glasses. She can not remember when was her recent visit to her optometrist. Her prozac has been resumed.   Past Medical History:  Diagnosis Date  . Headache   . Hypertension   . Vision abnormalities    History reviewed. No pertinent surgical history. Family History  Problem Relation Age of Onset  . Seizures Father    Current Outpatient Prescriptions  Medication Sig Dispense Refill  . acetaZOLAMIDE (DIAMOX) 250 MG tablet Take by mouth 2 (two) times daily.     Marland Kitchen FLUoxetine (PROZAC) 40 MG capsule Take by mouth.     No current facility-administered medications for this visit.    Allergies  Allergen Reactions  . Penicillins Hives   Social History   Social History  . Marital status: Married    Spouse name: N/A  . Number of children: N/A  . Years of education: N/A  Occupational History  . Not on file.   Social History Main Topics  . Smoking status: Current Some Day Smoker    Years: 1.00  . Smokeless tobacco: Never Used  . Alcohol use 1.2 oz/week    1 Cans of beer, 1 Glasses of wine per week     Comment: occasionally  . Drug use: No  . Sexual activity: Not on file   Other Topics Concern  . Not on file   Social History Narrative  . No narrative on file    Review of Systems Full 14 system review of systems performed and notable only for those listed, all others are neg:  Constitutional:   Cardiovascular:  Ear/Nose/Throat:   Skin:  Eyes:  Blurry  vision, eye pain Respiratory:   Gastroitestinal:  diarrhea Genitourinary:  Hematology/Lymphatic:   Endocrine: excessive thirst Musculoskeletal:  Back pain, muscle cramps Allergy/Immunology:   Neurological:  HA, numbness Psychiatric: nervous, anxiety Sleep:    Physical Exam  Vitals:   07/24/16 1108  BP: 137/89  Pulse: 68    General - Well nourished, well developed, in no apparent distress.  Ophthalmologic - subtle obscure of disc margin OU, nasal > temporal.  Cardiovascular - Regular rate and rhythm with no murmur. Carotid pulses were 2+ without bruits .   Neck - supple, no nuchal rigidity .  Mental Status -  Level of arousal and orientation to time, place, and person were intact. Language including expression, naming, repetition, comprehension, reading, and writing was assessed and found intact. Attention span and concentration were normal. Recent and remote memory were intact. Fund of Knowledge was assessed and was intact.  Cranial Nerves II - XII - II - Visual field intact OU. III, IV, VI - Extraocular movements intact, palpebral aperture left slightly small then right but no ptosis. V - Facial sensation intact bilaterally. VII - Facial movement intact bilaterally. VIII - Hearing & vestibular intact bilaterally. X - Palate elevates symmetrically. XI - Chin turning & shoulder shrug intact bilaterally. XII - Tongue protrusion intact.  Motor Strength - The patient's strength was normal in all extremities and pronator drift was absent.  Bulk was normal and fasciculations were absent.   Motor Tone - Muscle tone was assessed at the neck and appendages and was normal.  Reflexes - The patient's reflexes were normal in all extremities and she had no pathological reflexes.  Sensory - Light touch, temperature/pinprick, vibration and proprioception, and Romberg testing were assessed and were normal.    Coordination - The patient had normal movements in the hands and feet  with no ataxia or dysmetria.  Tremor was absent.  Gait and Station - The patient's transfers, posture, gait, station, and turns were observed as normal.   Imaging I have personally reviewed the radiological images below and agree with the radiology interpretations.  MRI brain with and without Normal   MRV head Normal   Mr Lumbar Spine W Wo Contrast 03/13/2016 IMPRESSION: 1. No abnormal contrast enhancement of the nerve roots or epidural fluid collection/hematoma. 2. 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 This is greatest at the L3-4 and L4-5 levels, where there are superimposed small disc bulges.   Lab Review Component     Latest Ref Rng & Units 03/09/2016  Color, CSF     Colorless Colorless  Clarity, CSF     Clear Clear  Nuc cell # CSF     0 - 5 cells/uL 2  RBC, CSF  None Seen /uL 33  Polys, CSF      CANCELED  Lymphs, CSF      CANCELED  Macrophages NFr CSF Manual      CANCELED  CELL FRACT CSF-IMP      Comment  Glucose, CSF     40 - 70 mg/dL 61  Protein, CSF     0.0 - 44.0 mg/dL 16.1 (H)  VDRL Quant, CSF     Non Rea:<1:1 Non Reactive  Oligoclonal Bands.IT Ser+CSF Ql      Comment   Component     Latest Ref Rng & Units 02/07/2016  TSH     0.450 - 4.500 uIU/mL 1.300  T4,Free(Direct)     0.82 - 1.77 ng/dL 0.96     Assessment:   In summary, Courtney Rasmussen is a 36 y.o. female with PMH of depression followed up in clinic for blurry vision and mild HA. Exam showed mild papilledema OU, with OD>OS, no ptosis. MRI and MRV normal, as well as normal CSF but OP 20cmH2O. She was put on diamox 250mg  bid. However, she complains of fingertip and toe as well as face and nose numbness and tingling, as well as diarrhea and increased urination. Will decrease morning dose of diamox to 125mg . Pt complains of worsening vision and needs new glasses. Will repeat LP to check intracranial pressure.   Plan: - decrease diamox to 125mg  am and 250mg  pm - will  repeat LP to check the pressure - will request last clinic visit report from her eye doctor  - follow up with PCP regularly - follow up with eye doctor for vision changes.  - follow up in 3 months    I spent more than 25 minutes of face to face time with the patient. Greater than 50% of time was spent in counseling and coordination of care. We discussed medication side effect, medication dose adjustment, and repeat LP.    Orders Placed This Encounter  Procedures  . DG FLUORO GUIDED LOC OF NEEDLE/CATH TIP FOR SPINAL INJECT LT    Standing Status:   Future    Standing Expiration Date:   09/21/2017    Order Specific Question:   Lab orders requested (DO NOT place separate lab orders, these will be automatically ordered during procedure specimen collection):    Answer:   CSF cell count w differential    Order Specific Question:   Lab orders requested (DO NOT place separate lab orders, these will be automatically ordered during procedure specimen collection):    Answer:   Protein and glucose, CSF    Order Specific Question:   Reason for Exam (SYMPTOM  OR DIAGNOSIS REQUIRED)    Answer:   pseudotumor. please check opening pressure    Order Specific Question:   Is the patient pregnant?    Answer:   No    Order Specific Question:   Preferred imaging location?    Answer:   Internal    Meds ordered this encounter  Medications  . acetaZOLAMIDE (DIAMOX) 250 MG tablet    Sig: Take by mouth 2 (two) times daily.   Marland Kitchen FLUoxetine (PROZAC) 40 MG capsule    Sig: Take by mouth.  . DISCONTD: LORazepam (ATIVAN) 0.5 MG tablet    Sig: Take 0.5 mg by mouth 2 (two) times daily.    Refill:  0    Patient Instructions  - decrease diamox to 125mg  am and 250mg  pm - will schedule LP to be done under X-ray to check the pressure -  will request last clinic visit report from your eye doctor  - follow up with PCP regularly - follow up with eye doctor for vision changes.  - follow up in 3 months with me    Marvel Plan, MD PhD [facility] [address] [place], [place] [address] [phone]

## 2016-07-24 NOTE — Patient Instructions (Signed)
-   decrease diamox to 125mg  am and 250mg  pm - will schedule LP to be done under X-ray to check the pressure - will request last clinic visit report from your eye doctor  - follow up with PCP regularly - follow up with eye doctor for vision changes.  - follow up in 3 months with me

## 2016-07-26 ENCOUNTER — Other Ambulatory Visit: Payer: Self-pay | Admitting: Neurology

## 2016-07-26 DIAGNOSIS — H4710 Unspecified papilledema: Secondary | ICD-10-CM

## 2016-08-06 ENCOUNTER — Ambulatory Visit
Admission: RE | Admit: 2016-08-06 | Discharge: 2016-08-06 | Disposition: A | Discharge status: Home / Self Care | Payer: BLUE CROSS/BLUE SHIELD | Source: Ambulatory Visit | Attending: Neurology | Admitting: Neurology

## 2016-08-06 ENCOUNTER — Telehealth: Payer: Self-pay | Admitting: Neurology

## 2016-08-06 VITALS — BP 168/94 | HR 66

## 2016-08-06 DIAGNOSIS — R200 Anesthesia of skin: Secondary | ICD-10-CM

## 2016-08-06 DIAGNOSIS — H4710 Unspecified papilledema: Secondary | ICD-10-CM

## 2016-08-06 DIAGNOSIS — H538 Other visual disturbances: Secondary | ICD-10-CM

## 2016-08-06 DIAGNOSIS — R202 Paresthesia of skin: Secondary | ICD-10-CM

## 2016-08-06 LAB — CSF CELL COUNT WITH DIFFERENTIAL
RBC COUNT CSF: 0 {cells}/uL (ref 0–10)
WBC CSF: 1 {cells}/uL (ref 0–5)

## 2016-08-06 LAB — PROTEIN, CSF: TOTAL PROTEIN, CSF: 52 mg/dL — AB (ref 15–45)

## 2016-08-06 LAB — GLUCOSE, CSF: Glucose, CSF: 61 mg/dL (ref 43–76)

## 2016-08-06 MED ORDER — HYDROXYZINE HCL 50 MG/ML IM SOLN
25.0000 mg | Freq: Once | INTRAMUSCULAR | Status: AC
  Administered 2016-08-06: 25 mg via INTRAMUSCULAR

## 2016-08-06 MED ORDER — MEPERIDINE HCL 100 MG/ML IJ SOLN
75.0000 mg | Freq: Once | INTRAMUSCULAR | Status: AC
  Administered 2016-08-06: 75 mg via INTRAMUSCULAR

## 2016-08-06 NOTE — Discharge Instructions (Signed)

## 2016-08-06 NOTE — Telephone Encounter (Signed)
Courtney Rasmussen/Courtney Rasmussen [phone] called said labs are available.

## 2016-08-06 NOTE — Telephone Encounter (Signed)
[facility] and [facility] stated labs were available for patient under labs. RN stated a message will be sent to Dr. Roda ShuttersXu for review.

## 2016-08-06 NOTE — Telephone Encounter (Signed)
Discussed with patient over the phone, and inform her about CSF results and opening pressure during LP. Her opening pressure was 17, within normal range, indicating her recent vision changes not related to intracranial pressure. CSF labs unremarkable. Patient still complained of diarrhea pretty bad, will decrease Diamox to 125 mg twice a day. The patient still cannot tolerate, will consider to taper off Diamox and monitor vision changes. Patient expressed understanding and appreciation.  Marvel PlanJindong Othel Dicostanzo, MD PhD Stroke Neurology 08/06/2016 5:36 PM

## 2016-11-13 ENCOUNTER — Ambulatory Visit: Payer: BLUE CROSS/BLUE SHIELD | Admitting: Neurology

## 2017-07-02 IMAGING — MR MR LUMBAR SPINE WO/W CM
4 of 7 series · 18 of 48 positions shown · IV contrast (multihance)
Comparison: None.

CLINICAL DATA: Gradually worsening lower back pain common near site
of recent LP.

EXAM:
MRI LUMBAR SPINE WITHOUT AND WITH CONTRAST
TECHNIQUE: Multiplanar and multiecho pulse sequences of the lumbar spine were
obtained without and with intravenous contrast.
CONTRAST:  18mL MULTIHANCE GADOBENATE DIMEGLUMINE 529 MG/ML IV SOLN

[Series 3: T2 · sagittal · 4.0mm · 0.55mm/px · 3 of 12 slices shown (1 of 2)]
[im 1/12]
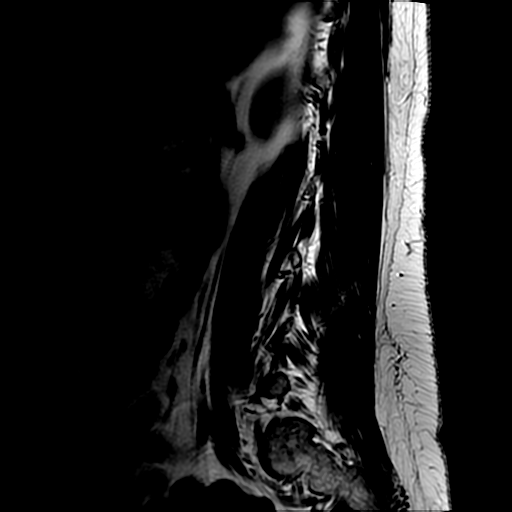
[im 6/12]
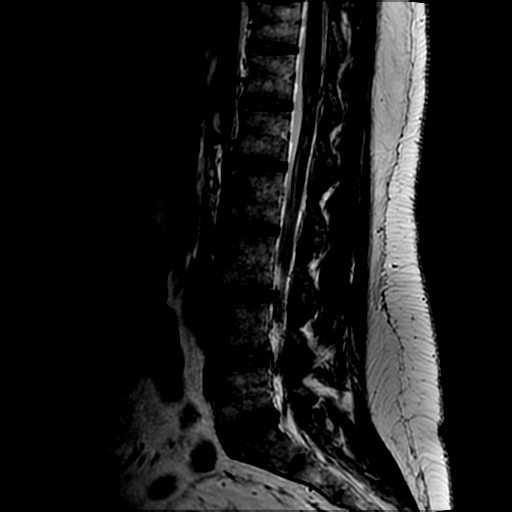
[im 12/12]
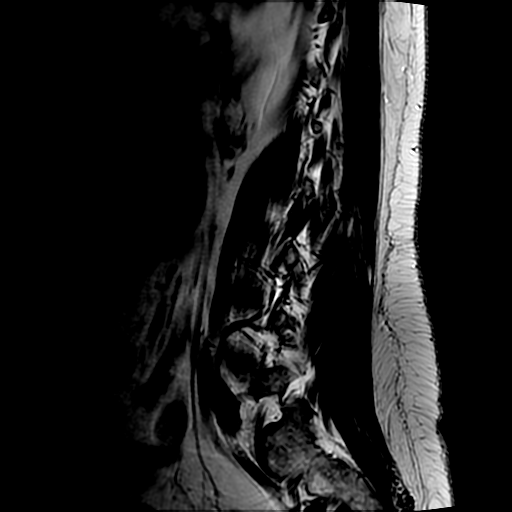

[Series 5: T1 · sagittal · 4.0mm · 0.55mm/px · 3 of 12 slices shown (1 of 2)]
[im 1/12]
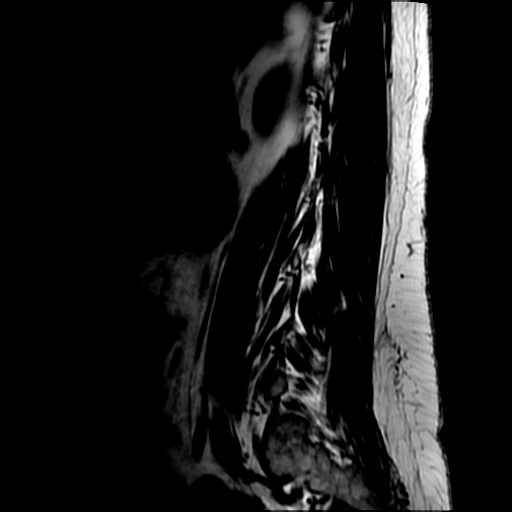
[im 8/12]
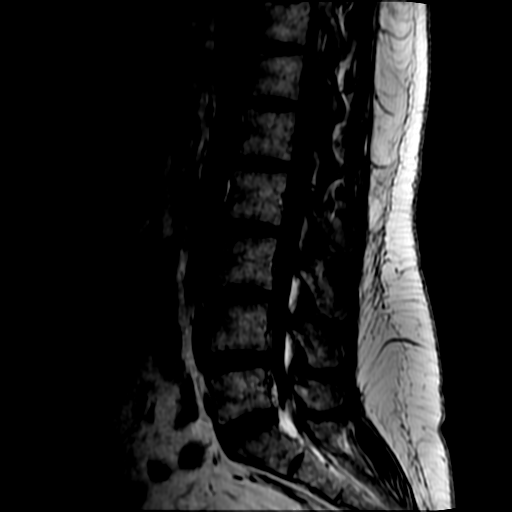
[im 12/12]
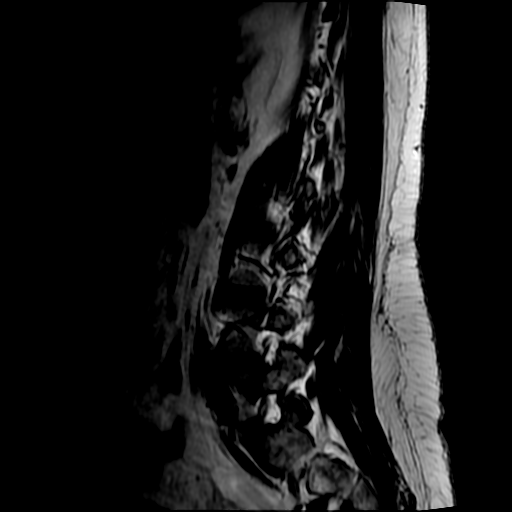

[Series 6: T2 · axial · 4.0mm · 0.39mm/px · z∈[-115,+45]mm · 9 of 32 slices shown (2 of 2)]
[im 1/32]
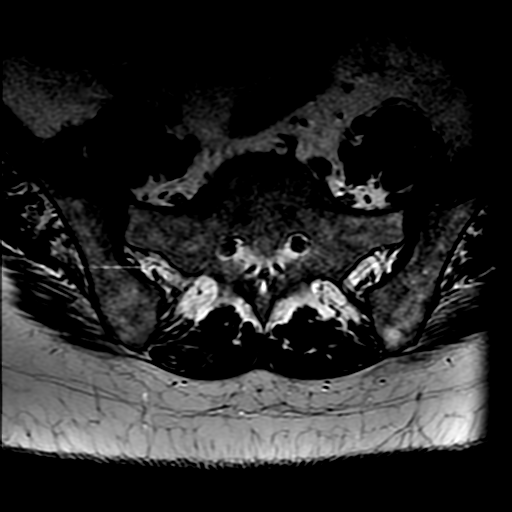
[im 4/32]
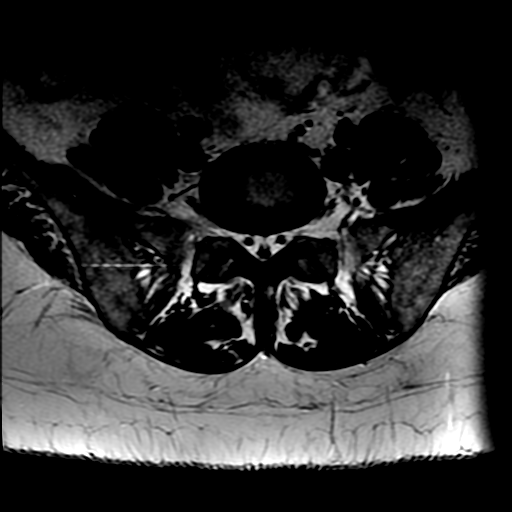
[im 7/32]
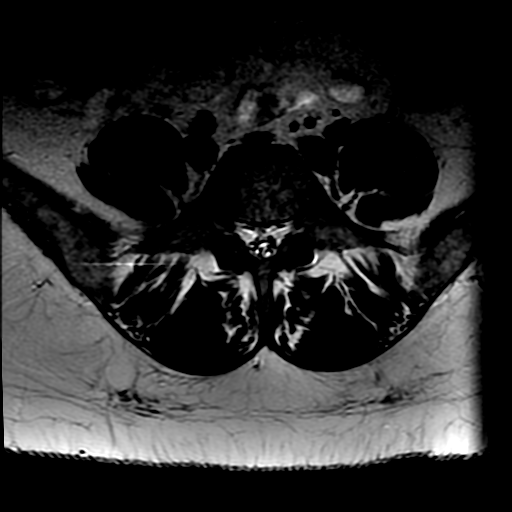
[im 10/32]
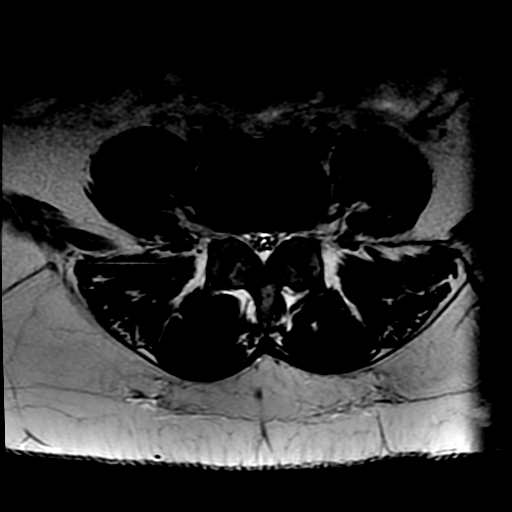
[im 13/32]
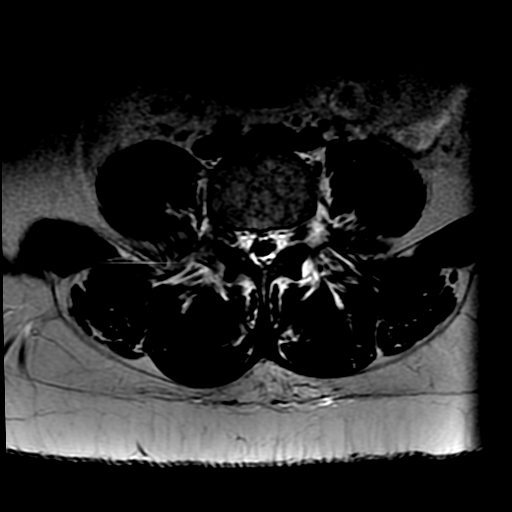
[im 16/32]
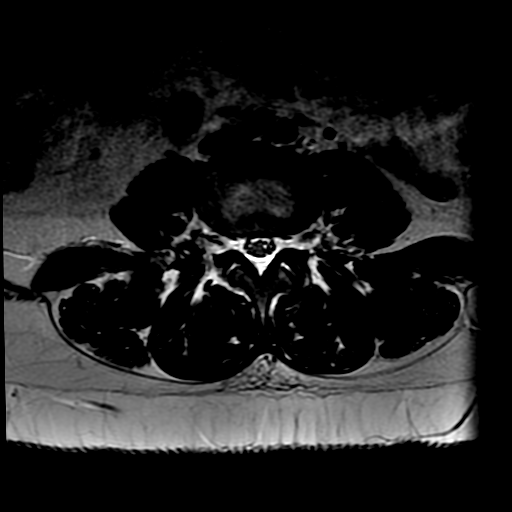
[im 19/32]
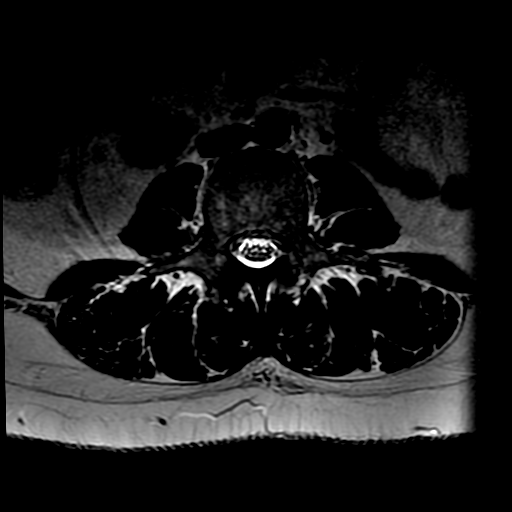
[im 22/32]
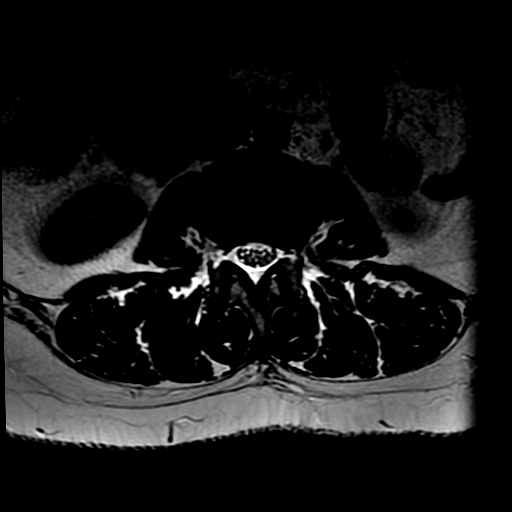
[im 28/32]
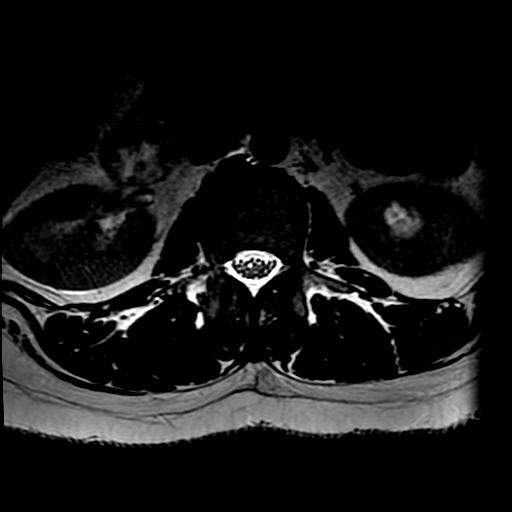

[Series 7: T1 · axial · 4.0mm · 0.39mm/px · z∈[-100,+45]mm · 3 of 32 slices shown (2 of 2)]
[im 4/32]
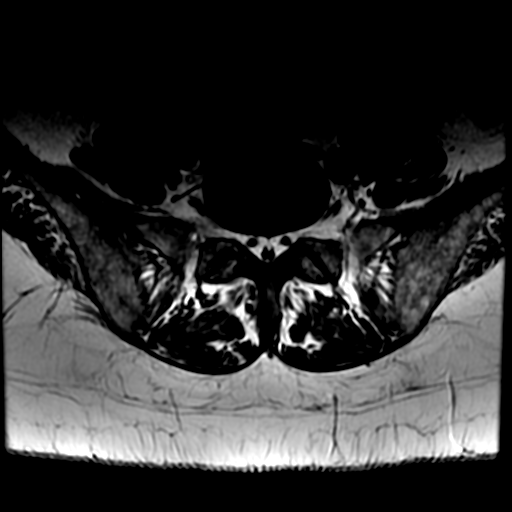
[im 16/32]
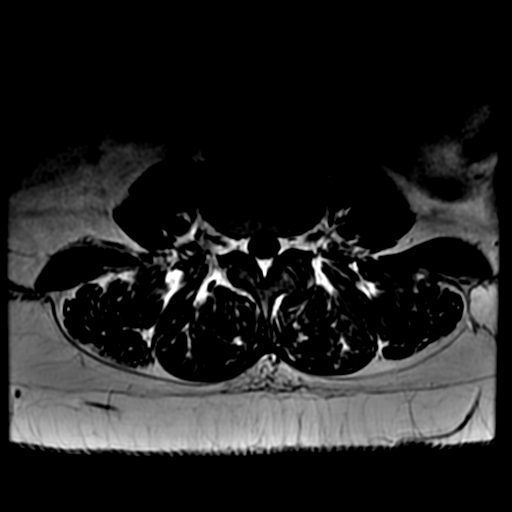
[im 28/32]
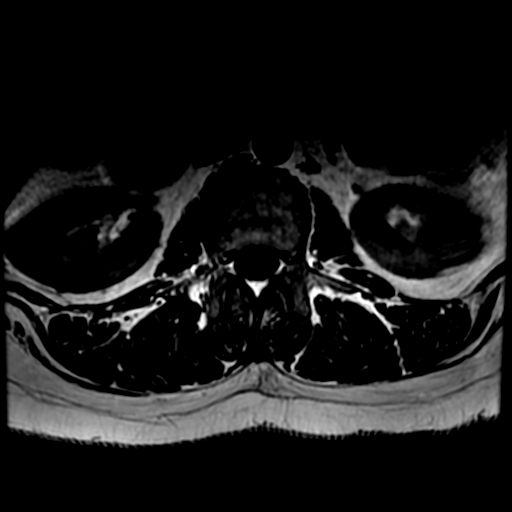

[18 of 48 positions shown; findings below may reference images not displayed]

FINDINGS: Segmentation:  Normal

Alignment:  Normal

Vertebrae: No acute compression fracture, facet edema or focal
marrow lesion.

Conus medullaris: Extends to the L1 level and appears normal. There
is no abnormal contrast enhancement of the nerve roots. There is no
epidural hematoma or fluid collection.

Paraspinal and other soft tissues: The visualized aorta, IVC and
iliac vessels are normal. The visualized retroperitoneal organs and
paraspinal soft tissues are normal.

Disc levels:

There is congenital narrowing of the spinal canal secondary to short
pedicles.

T12-L1: Normal disc space and facet joints. No spinal canal
stenosis. No neuroforaminal stenosis.

L1-L2: Normal disc space and facet joints. No spinal canal stenosis.
No neuroforaminal stenosis.

L2-L3: Normal disc space and facet joints. No spinal canal stenosis.
No neuroforaminal stenosis.

L3-L4: Minimal disc bulge and mild facet hypertrophy. Narrowing of
the spinal canal secondary to congenitally short pedicles. No
neuroforaminal stenosis.

L4-L5: Minimal disc bulge and mild facet hypertrophy. Mild to
moderate narrowing of the spinal canal due to congenital short
pedicles. No neuroforaminal stenosis.

L5-S1: Normal disc space and facet joints. No spinal canal stenosis.
No neuroforaminal stenosis.
IMPRESSION: 1. No abnormal contrast enhancement of the nerve roots or epidural
fluid collection/hematoma.
2. Diffuse narrowing of the spinal canal due to congenital short
pedicles. This is greatest at the L3-4 and L4-5 levels, where there
are superimposed small disc bulges.
